# Patient Record
Sex: Female | Born: 1970 | Race: White | Hispanic: No | Marital: Married | Smoking: Never smoker
Health system: Southern US, Community
[De-identification: ages and names within clinical notes are randomized; demographics above are authoritative.]

## PROBLEM LIST (undated history)

## (undated) HISTORY — PX: BREAST SURGERY: SHX581

---

## 2010-12-14 ENCOUNTER — Ambulatory Visit: Payer: Self-pay | Admitting: General Practice

## 2011-10-19 ENCOUNTER — Ambulatory Visit: Payer: Self-pay | Admitting: General Practice

## 2012-02-12 IMAGING — US ABDOMEN ULTRASOUND
1 series · 17 of 25 positions shown · non-contrast
Comparison: none

REASON FOR EXAM: STAT CR  [id number] after 5pm Tantoun Billi at 214
5794 RUQ and epigastric...
COMMENTS:

[Series 1: abdomen ultrasound · 17 of 86 slices shown]
[im 1/86]
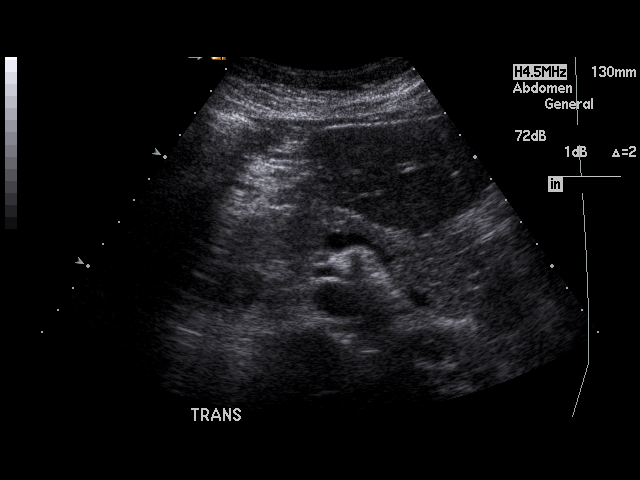
[im 8/86]
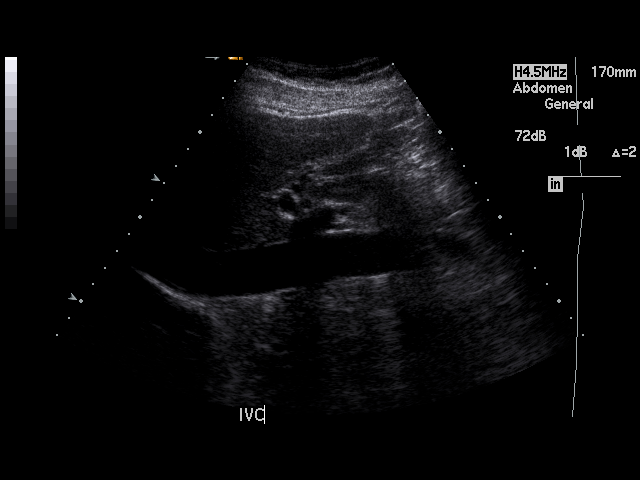
[im 11/86]
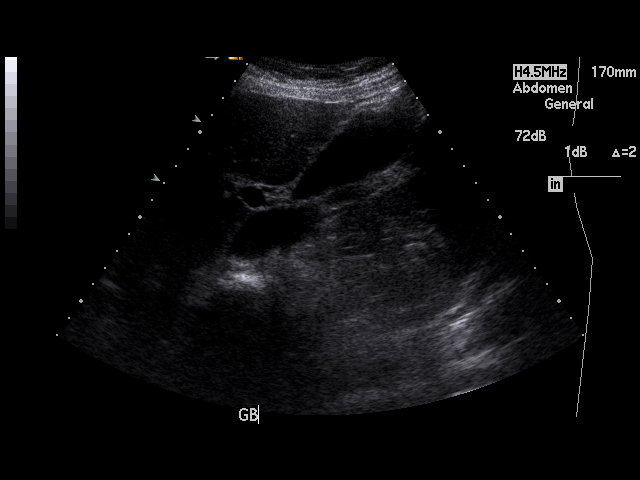
[im 18/86]
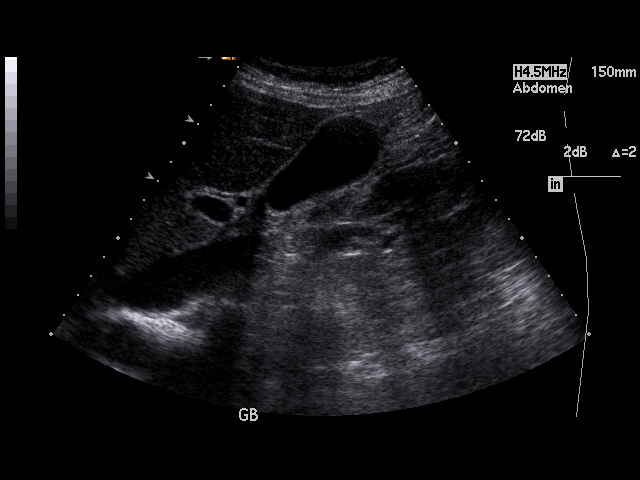
[im 22/86]
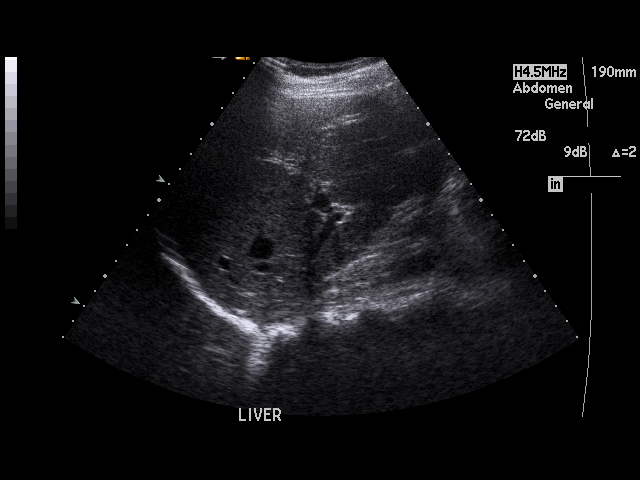
[im 29/86]
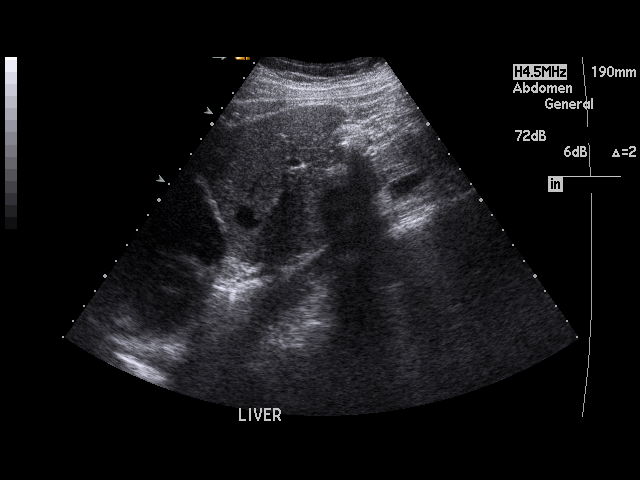
[im 32/86]
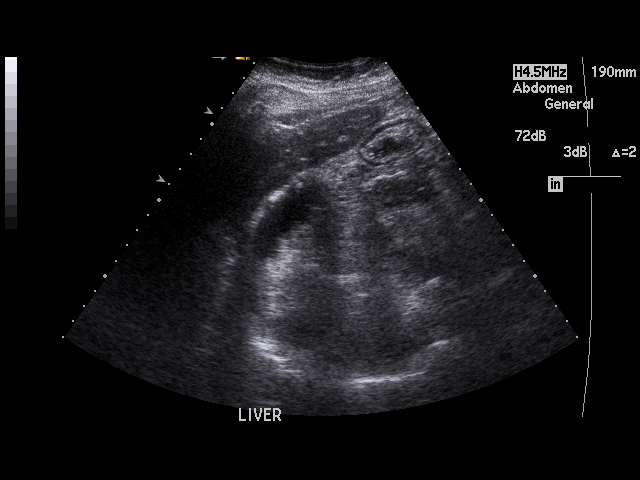
[im 39/86]
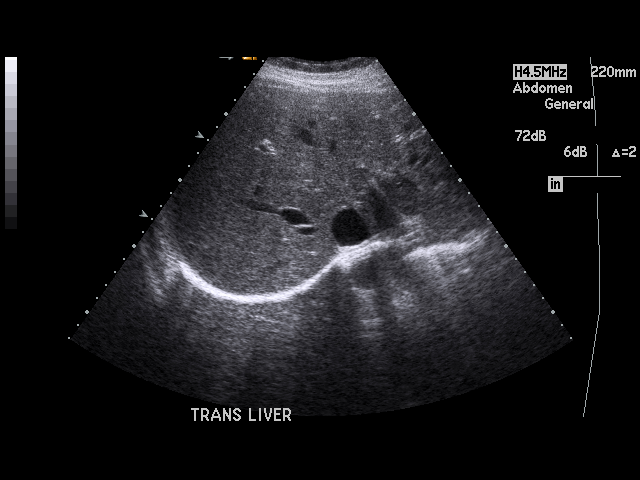
[im 43/86]
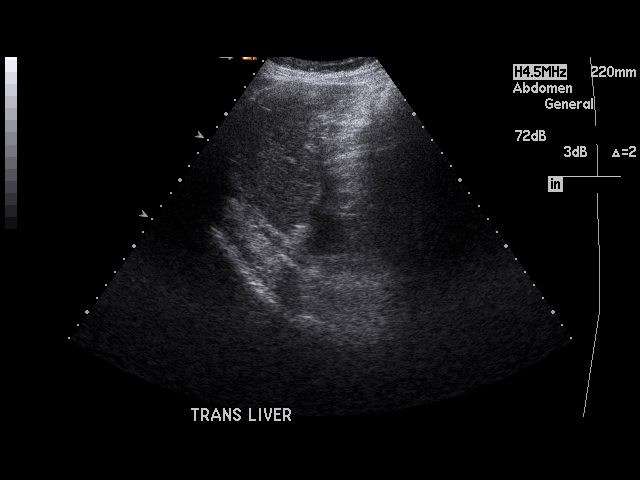
[im 47/86]
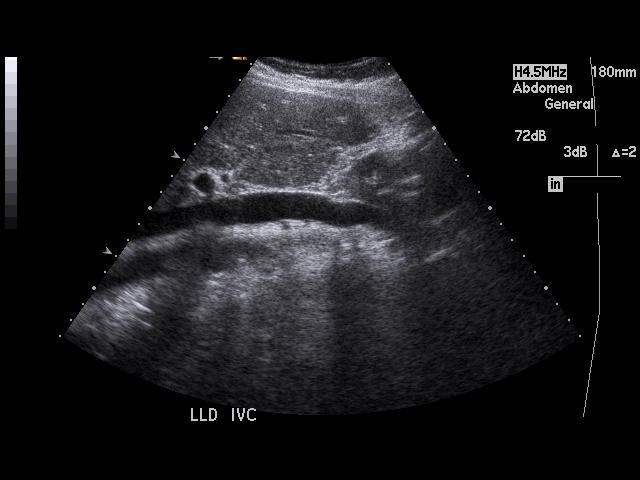
[im 54/86]
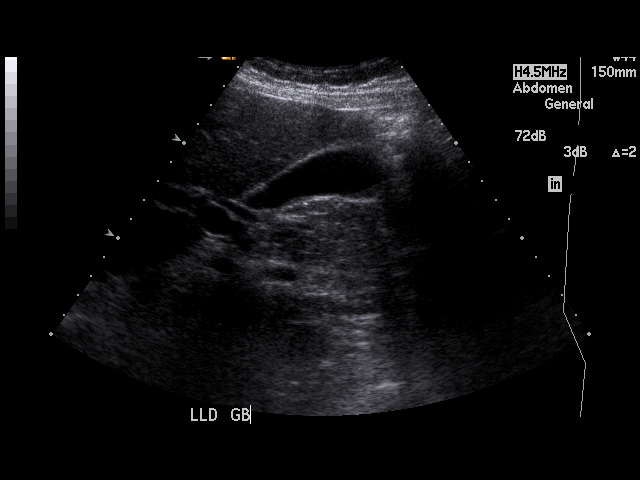
[im 57/86]
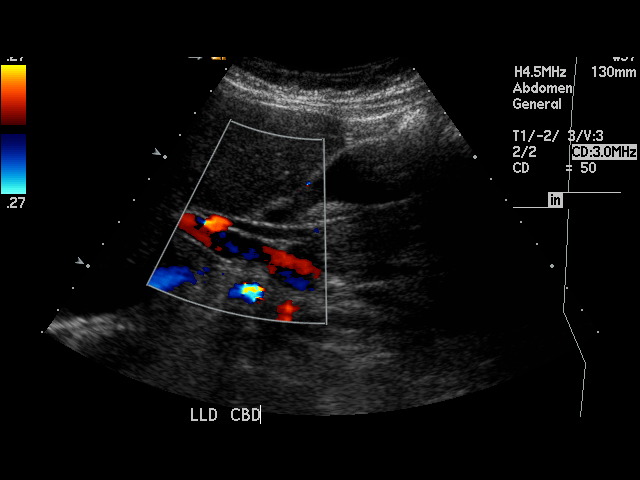
[im 64/86]
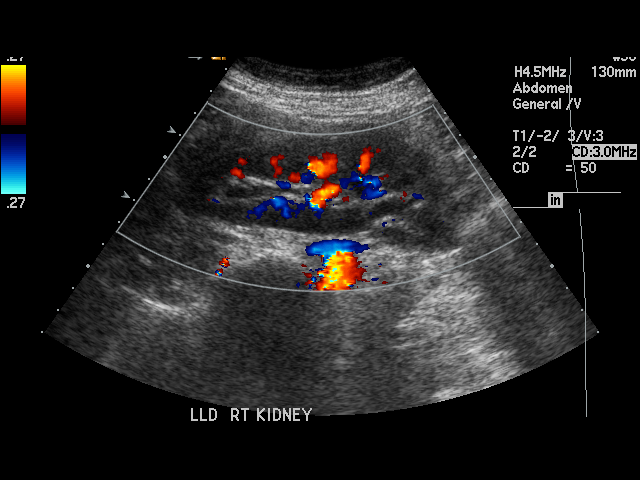
[im 68/86]
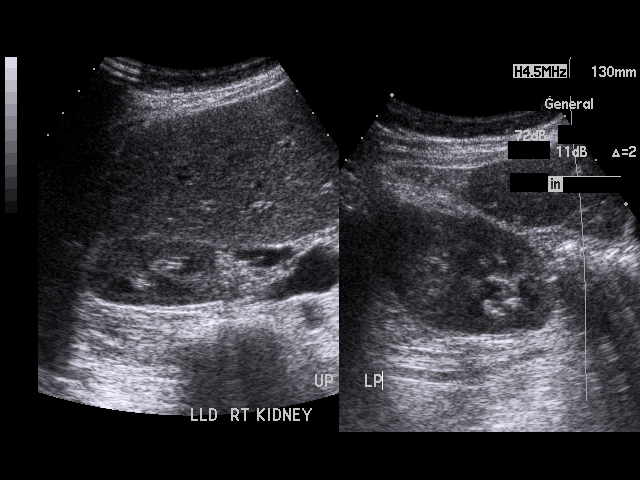
[im 75/86]
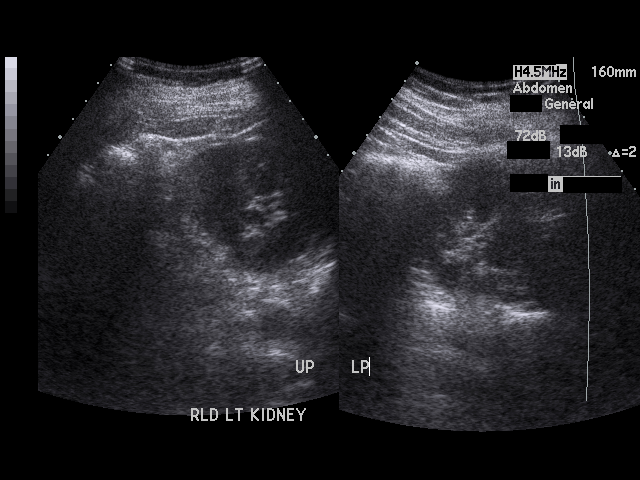
[im 78/86]
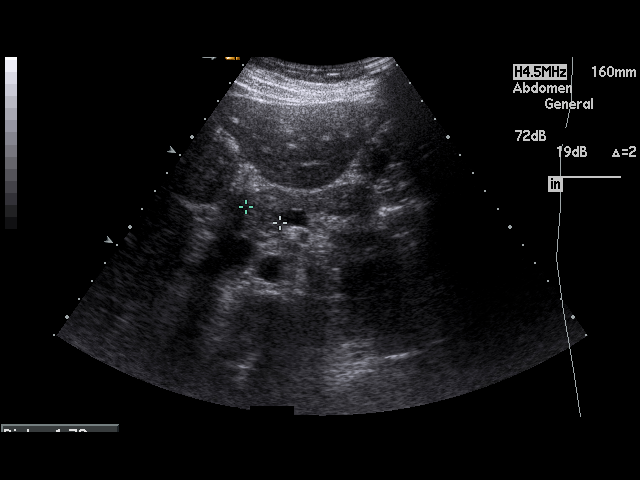
[im 86/86]
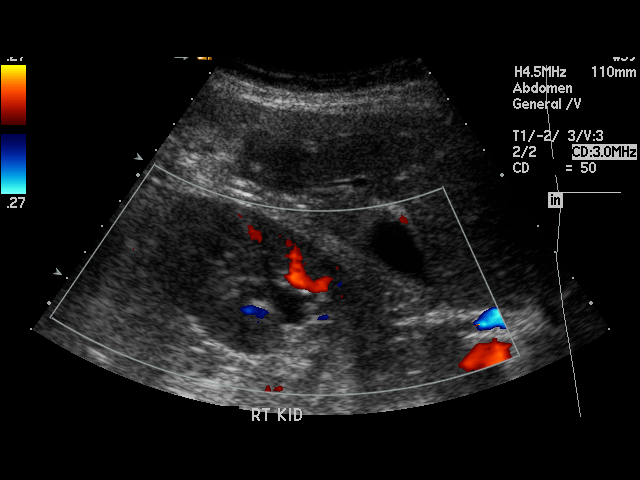

[17 of 25 positions shown; findings below may reference images not displayed]

PROCEDURE:     US  - US ABDOMEN GENERAL SURVEY  - December 14, 2010  [DATE]

RESULT:     The liver exhibits normal echotexture with no focal mass nor
ductal dilation. Portal venous flow is normal in direction toward the liver.
The gallbladder is adequately distended with no evidence of stones, wall
thickening, or pericholecystic fluid. There is no positive sonographic
Murphy's sign. The common bile duct is normal at 5.6 mm in diameter. The
pancreas, spleen, abdominal aorta, and left kidney are normal in appearance.
There is a small amount of splitting of the central echo complex on the
right which may reflect minimal hydronephrosis. There is no evidence of
perinephric fluid.
IMPRESSION: 1. I do not see evidence of acute hepatobiliary abnormality.
2. There is minimal hydronephrosis on the right.

## 2015-12-01 ENCOUNTER — Ambulatory Visit: Payer: Self-pay | Admitting: Physician Assistant

## 2015-12-01 ENCOUNTER — Encounter: Payer: Self-pay | Admitting: Physician Assistant

## 2015-12-01 VITALS — BP 100/60 | HR 84 | Temp 97.8°F

## 2015-12-01 DIAGNOSIS — R05 Cough: Secondary | ICD-10-CM

## 2015-12-01 DIAGNOSIS — R059 Cough, unspecified: Secondary | ICD-10-CM

## 2015-12-01 MED ORDER — METHYLPREDNISOLONE 4 MG PO TBPK: ORAL_TABLET | ORAL | Status: DC

## 2015-12-01 MED ORDER — BENZONATATE 200 MG PO CAPS: 200.0000 mg | ORAL_CAPSULE | Freq: Three times a day (TID) | ORAL | Status: DC | PRN

## 2015-12-01 NOTE — Progress Notes (Signed)
S: C/o runny nose and congestion for 3 days, no fever, chills, cp/sob, v/d; mucus  clear throughout the day, cough is sporadic,   O: PE: vitals wnl, nad, perrl eomi, normocephalic, tms dull, nasal mucosa red and swollen, throat injected, neck supple no lymph, lungs c t a, cv rrr, neuro intact  A:  Acute viral uri   P: medrol dose pack, tessalon perls; drink fluids, continue regular meds , use otc meds of choice, return if not improving in 5 days, return earlier if worsening

## 2015-12-02 ENCOUNTER — Ambulatory Visit: Payer: Self-pay | Admitting: Physician Assistant

## 2016-02-04 ENCOUNTER — Encounter: Payer: Self-pay | Admitting: Physician Assistant

## 2016-02-04 ENCOUNTER — Ambulatory Visit: Payer: Self-pay | Admitting: Physician Assistant

## 2016-02-04 VITALS — BP 112/64 | HR 76 | Temp 98.0°F

## 2016-02-04 DIAGNOSIS — H9202 Otalgia, left ear: Secondary | ICD-10-CM

## 2016-02-04 NOTE — Progress Notes (Signed)
S: c/o fb sensation in left ear, tickling sensation, no drainage, no dif hearing, no pain  O: vitals wnl, nad, tms dull b/l, no fb noted in canals b/l, neck supple no lymph, lungs c t a, cv rrr  A: ear discomfort  P: otc sudafed, flonase

## 2016-03-18 ENCOUNTER — Other Ambulatory Visit: Payer: Self-pay | Admitting: Physician Assistant

## 2016-06-17 ENCOUNTER — Ambulatory Visit: Payer: Self-pay | Admitting: Physician Assistant

## 2016-06-17 ENCOUNTER — Encounter: Payer: Self-pay | Admitting: Physician Assistant

## 2016-06-17 VITALS — BP 100/60 | HR 80 | Temp 98.7°F

## 2016-06-17 DIAGNOSIS — S46912A Strain of unspecified muscle, fascia and tendon at shoulder and upper arm level, left arm, initial encounter: Secondary | ICD-10-CM

## 2016-06-17 MED ORDER — METHOCARBAMOL 750 MG PO TABS: 750.0000 mg | ORAL_TABLET | Freq: Four times a day (QID) | ORAL | Status: DC

## 2016-06-17 MED ORDER — TRAMADOL HCL 50 MG PO TABS: 50.0000 mg | ORAL_TABLET | Freq: Three times a day (TID) | ORAL | Status: DC

## 2016-06-17 NOTE — Progress Notes (Signed)
   Subjective:Left supper shoulder pain    Patient ID: Lori Norton, female    DOB: 06/07/1971, 45 y.o.   MRN: [id number]  HPI Patient complain of left upper should pain that radiates to neck for 5 days. Status post overhead pulling incident. Patient states decrease range of motion with abduction and overhead reaching. No palliative measure for compliant. Patient is right hand dominate.   Review of Systems Negative except for compliant    Objective:   Physical Exam No obvious deformity to left upper extremity. No edema or erythema. N/V intact. Decrease Range of motion with overhead reaching and abduction. Guarding with palpation A/C joint.       Assessment & Plan:Left A/C strain  Sling for 2-3 days. Take Robaxin and Tramadol as directed. Continue Mobic.  Follow up 3 days if no improvement.

## 2016-07-13 ENCOUNTER — Ambulatory Visit: Payer: Self-pay | Admitting: Physician Assistant

## 2016-07-13 ENCOUNTER — Encounter: Payer: Self-pay | Admitting: Physician Assistant

## 2016-07-13 VITALS — BP 119/65 | HR 75 | Temp 98.3°F

## 2016-07-13 DIAGNOSIS — S93509A Unspecified sprain of unspecified toe(s), initial encounter: Secondary | ICD-10-CM

## 2016-07-13 MED ORDER — AZITHROMYCIN 250 MG PO TABS: ORAL_TABLET | ORAL | 0 refills | Status: DC

## 2016-07-13 MED ORDER — ONDANSETRON HCL 4 MG PO TABS: 4.0000 mg | ORAL_TABLET | Freq: Three times a day (TID) | ORAL | 0 refills | Status: DC | PRN

## 2016-07-13 NOTE — Progress Notes (Signed)
S: c/o toe pain, states she slipped when getting out of the shower and bent her toe back underneath her foot, some pain , hurts to walk on, no bruising, no other injury, also ? If needs travel meds, traveling to [place]  O: vitals wnl, nad, skin intact, no bruising noted, left great toe tender to palp at distal area, full rom, has good strenght in toe, n/v intact  A: sprained toe  P: wooden shoe, ice elevate, also gave rx for zpack and zofran [place]

## 2016-08-22 ENCOUNTER — Encounter: Payer: Self-pay | Admitting: Physician Assistant

## 2016-08-22 ENCOUNTER — Ambulatory Visit: Payer: Self-pay | Admitting: Physician Assistant

## 2016-08-22 VITALS — BP 124/61 | HR 77 | Temp 98.4°F

## 2016-08-22 DIAGNOSIS — N390 Urinary tract infection, site not specified: Secondary | ICD-10-CM

## 2016-08-22 DIAGNOSIS — N898 Other specified noninflammatory disorders of vagina: Secondary | ICD-10-CM

## 2016-08-22 LAB — POCT URINALYSIS DIPSTICK
BILIRUBIN UA: NEGATIVE
GLUCOSE UA: NEGATIVE
KETONES UA: NEGATIVE
LEUKOCYTES UA: NEGATIVE
NITRITE UA: NEGATIVE
Protein, UA: NEGATIVE
Spec Grav, UA: 1.01
Urobilinogen, UA: 0.2
pH, UA: 7

## 2016-08-22 NOTE — Progress Notes (Signed)
S: c/o fishy odor with vaginal discharge, no fever/chills, no itching, periods are irregular, mood is all over the place, ?if menopausal  O: vitals wnl, nad, ua wnl  A: perimenopausal  P: pt to see her gyn as we do not do pelvics in clinic

## 2016-10-19 ENCOUNTER — Other Ambulatory Visit: Payer: Self-pay | Admitting: *Deleted

## 2016-10-19 MED ORDER — IVERMECTIN 0.5 % EX LOTN: 1.0000 "application " | TOPICAL_LOTION | Freq: Once | CUTANEOUS | 2 refills | Status: AC

## 2017-05-23 ENCOUNTER — Other Ambulatory Visit: Payer: Self-pay | Admitting: *Deleted

## 2017-05-23 MED ORDER — FLUCONAZOLE 150 MG PO TABS: 150.0000 mg | ORAL_TABLET | Freq: Once | ORAL | 3 refills | Status: AC

## 2017-10-31 ENCOUNTER — Ambulatory Visit: Payer: Self-pay | Admitting: Physician Assistant

## 2017-10-31 ENCOUNTER — Encounter: Payer: Self-pay | Admitting: Physician Assistant

## 2017-10-31 VITALS — BP 145/65 | HR 84 | Temp 98.5°F | Resp 16

## 2017-10-31 DIAGNOSIS — S46912A Strain of unspecified muscle, fascia and tendon at shoulder and upper arm level, left arm, initial encounter: Secondary | ICD-10-CM

## 2017-10-31 MED ORDER — NAPROXEN 500 MG PO TABS: 500.0000 mg | ORAL_TABLET | Freq: Two times a day (BID) | ORAL | Status: DC

## 2017-10-31 MED ORDER — ORPHENADRINE CITRATE ER 100 MG PO TB12: 100.0000 mg | ORAL_TABLET | Freq: Two times a day (BID) | ORAL | 0 refills | Status: DC

## 2017-10-31 NOTE — Progress Notes (Signed)
   Subjective: Left shoulder pain     Patient ID: Lori Norton, female    DOB: 01/12/1971, 46 y.o.   MRN: [id number]  HPI Patient complaining of 3 days of left posterior shoulder pain. Patient stated pain radiates from her scapular and to superior aspect of her left shoulder. Onset of pain began after reaching and pulling out  Christmas decorations. Patient rates the pain as 8/10. Patient described a pain as "achy". Patient stated no relief with anti-inflammatory medications. Patient is right-hand dominant. Patient denies loss of sensation but decreased range of motion.   Review of Systems Negative except for complaint    Objective:   Physical Exam  No obvious deformities to the left upper extremity. Patient decreased range of motion with abduction overhead reaching. Patient straight against resistance 3/5 compared to the right. Patient is moderate guarding palpation inferior scapular muscle area.       Assessment & Plan: Left scapular muscle strain   Patient given discharge care instructions. Patient given a prescription for Norflex and naproxen. Advised to follow-up in one week.

## 2017-11-03 ENCOUNTER — Telehealth: Payer: Self-pay | Admitting: Emergency Medicine

## 2017-11-03 NOTE — Telephone Encounter (Signed)
Patient called and expressed that her pain level is getting worse and wanted to know if there was a different muscle relaxer medication we can call in.  I informed Durward Parcelon Smith, PA and he said that he has already given her the medication that should be working.  He advised that the patient needs to be referred to see an orthopedics since she continues to have pain.  I called the patient and left a message on her voicemail.

## 2017-12-29 ENCOUNTER — Ambulatory Visit: Payer: Self-pay | Admitting: Family Medicine

## 2017-12-29 VITALS — BP 128/76 | HR 73 | Temp 97.6°F

## 2017-12-29 DIAGNOSIS — J019 Acute sinusitis, unspecified: Secondary | ICD-10-CM

## 2017-12-29 DIAGNOSIS — J069 Acute upper respiratory infection, unspecified: Secondary | ICD-10-CM

## 2017-12-29 DIAGNOSIS — B9789 Other viral agents as the cause of diseases classified elsewhere: Secondary | ICD-10-CM

## 2017-12-29 MED ORDER — IPRATROPIUM BROMIDE 0.03 % NA SOLN
2.0000 | Freq: Two times a day (BID) | NASAL | 0 refills | Status: DC
Start: 2017-12-29 — End: 2018-03-01

## 2017-12-29 NOTE — Patient Instructions (Signed)
Drink plenty of fluids and get enough rest.  Try to take it easy this weekend.  Use the Atrovent nasal spray 1- 2 sprays each nostril 4 times a day if needed.  Use it as instructed.  Take over-the-counter Claritin-D or Allegra-D if needed for additional treatment of the congestion and drainage.    Take Tylenol or ibuprofen as needed for fever, aches, or generalized malaise.  Wash hands well and try to avoid exposing her family.  Return as needed

## 2017-12-29 NOTE — Progress Notes (Signed)
Patient ID: Lori Norton, female    DOB: 07/20/1971  Age: 47 y.o. MRN: [id number]  Chief Complaint  Patient presents with  . Sinus Problem    x3 days  . Nasal Congestion    x3 days    Subjective:   The patient presents with a 3-day history of nasal congestion.  She has blown some purulent looking mucus out some clear.  She does not smoke.  She does not have excessive numbers of URIs.  She has not been febrile.  Does not describe a lot of body aching.  Is not coughing a lot.  She is fatigued.  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  She has 3 adolescent and the preadolescent children at home, has a hard time getting extra rest.  She is on lansoprazole and sertraline.  Current allergies, medications, problem list, past/family and social histories reviewed.  Objective:  BP 128/76 (BP Location: Left Arm, Patient Position: Sitting, Cuff Size: Normal)   Pulse 73   Temp 97.6 F (36.4 C) (Oral)   SpO2 97%   No major acute distress except for has an obvious cold and stuffy head.  TMs are normal.  Nose has clear mucus in it and swelling of the turbinates.  Her throat looks clear though she has had some sore throat from the drainage.  Her mouth feels dry at night. Without significant nodes.  Chest is clear to auscultation.  Heart regular without murmurs.  Assessment & Plan:   Assessment: 1. Viral URI   2. Acute viral sinusitis       Plan: This is most consistent with a viral URI.  Treat symptomatically.  No orders of the defined types were placed in this encounter.   No orders of the defined types were placed in this encounter.        Patient Instructions  Drink plenty of fluids and get enough rest.  Try to take it easy this weekend.  Use the Atrovent nasal spray 1- 2 sprays each nostril 4 times a day if needed.  Use it as instructed.  Take over-the-counter Claritin-D or Allegra-D if needed for additional treatment of the congestion and drainage.    Take Tylenol or  ibuprofen as needed for fever, aches, or generalized malaise.  Wash hands well and try to avoid exposing her family.  Return as needed    No Follow-up on file.   HOPPER,DAVID, MD 12/29/2017

## 2018-01-01 ENCOUNTER — Encounter: Payer: Self-pay | Admitting: Emergency Medicine

## 2018-01-01 ENCOUNTER — Other Ambulatory Visit: Payer: Self-pay

## 2018-01-01 ENCOUNTER — Emergency Department
Admission: EM | Admit: 2018-01-01 | Discharge: 2018-01-02 | Disposition: A | Discharge status: Home / Self Care | Payer: Managed Care, Other (non HMO) | Attending: Emergency Medicine | Admitting: Emergency Medicine

## 2018-01-01 DIAGNOSIS — Z79899 Other long term (current) drug therapy: Secondary | ICD-10-CM | POA: Diagnosis not present

## 2018-01-01 DIAGNOSIS — I951 Orthostatic hypotension: Secondary | ICD-10-CM | POA: Insufficient documentation

## 2018-01-01 DIAGNOSIS — R42 Dizziness and giddiness: Secondary | ICD-10-CM

## 2018-01-01 DIAGNOSIS — R531 Weakness: Secondary | ICD-10-CM | POA: Diagnosis not present

## 2018-01-01 LAB — BASIC METABOLIC PANEL
Anion gap: 10 (ref 5–15)
BUN: 13 mg/dL (ref 6–20)
CHLORIDE: 101 mmol/L (ref 101–111)
CO2: 24 mmol/L (ref 22–32)
CREATININE: 0.75 mg/dL (ref 0.44–1.00)
Calcium: 9.2 mg/dL (ref 8.9–10.3)
GFR calc non Af Amer: >60 mL/min (ref >60)
Glucose, Bld: 111 mg/dL — ABNORMAL HIGH (ref 65–99)
Potassium: 3.1 mmol/L — ABNORMAL LOW (ref 3.5–5.1)
SODIUM: 135 mmol/L (ref 135–145)

## 2018-01-01 LAB — CBC
HEMATOCRIT: 39.5 % (ref 35.0–47.0)
Hemoglobin: 12.9 g/dL (ref 12.0–16.0)
MCH: 26.5 pg (ref 26.0–34.0)
MCHC: 32.6 g/dL (ref 32.0–36.0)
MCV: 81.4 fL (ref 80.0–100.0)
Platelets: 251 10*3/uL (ref 150–440)
RBC: 4.85 MIL/uL (ref 3.80–5.20)
RDW: 15.3 % — ABNORMAL HIGH (ref 11.5–14.5)
WBC: 10.4 10*3/uL (ref 3.6–11.0)

## 2018-01-01 NOTE — Discharge Instructions (Signed)
Please seek medical attention for any high fevers, chest pain, shortness of breath, change in behavior, persistent vomiting, bloody stool or any other new or concerning symptoms.  

## 2018-01-01 NOTE — ED Notes (Signed)
Pt refuses IV at this time. Derrill KayGoodman MD aware.

## 2018-01-01 NOTE — ED Triage Notes (Signed)
Pt presents to ED with c/o dizziness and hypotension today. Pt states she had a sudden onset of dizziness around 1300 while at work and was encouraged by her employer to be seen at urgent care. Pt became more hypotensive while [facility] and they sent her to ED for further evaluation. C/o worsening dizziness upon standing. Denies n/v/d. Denies chest pain.

## 2018-01-01 NOTE — ED Notes (Signed)
Pt c/o worsening dizziness after blood was drawn. Provided for pt safety. Blood pressure 75/36.

## 2018-01-01 NOTE — ED Notes (Signed)
Pt presents today for hypotension. Pt states after lunch Pt felt lightheaded and weak. 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 Pt was dizzy and couldn't hear like she was going pass out.

## 2018-01-02 LAB — TROPONIN I: Troponin I: <0.03 ng/mL (ref <0.03)

## 2018-01-02 NOTE — ED Provider Notes (Signed)
[facility] Emergency Department Provider Note   ____________________________________________   I have reviewed the triage vital signs and the nursing notes.   HISTORY  Chief Complaint Hypotension and Dizziness   History limited by: Not Limited   HPI Lori Norton is a 47 y.o. female who presents to the emergency department today because of concern for lightheadedness, weakness and hypotension.  Patient states she was at work when this happened.  She noticed that when she was switching positions from sitting to standing.  She tried to lie down in the past however did not.  At work she checked her blood pressure was noted to be low.  She denies concurrent chest pain or palpitations.   Per medical record review patient has a history of breast surgery.  History reviewed. No pertinent past medical history.  There are no active problems to display for this patient.   Past Surgical History:  Procedure Laterality Date  . BREAST SURGERY      Prior to Admission medications   Medication Sig Start Date End Date Taking? Authorizing Provider  ipratropium (ATROVENT) 0.03 % nasal spray Place 2 sprays into both nostrils 2 (two) times daily. 12/29/17   Peyton Najjar, MD  pantoprazole (PROTONIX) 40 MG tablet  12/01/15   [provider]  sertraline (ZOLOFT) 100 MG tablet Take 100 mg by mouth daily.    [provider]    Allergies Patient has no known allergies.  No family history on file.  Social History Social History   Tobacco Use  . Smoking status: Never Smoker  . Smokeless tobacco: Never Used  Substance Use Topics  . Alcohol use: Yes    Alcohol/week: 0.0 oz    Frequency: Never  . Drug use: No    Review of Systems Constitutional: No fever/chills. Positive for lightheadedness. Eyes: No visual changes. ENT: No sore throat. Cardiovascular: Denies chest pain. Respiratory: Denies shortness of breath. Gastrointestinal: No abdominal  pain.  No nausea, no vomiting.  No diarrhea.   Genitourinary: Negative for dysuria. Musculoskeletal: Negative for back pain. Skin: Negative for rash. Neurological: Negative for headaches, focal weakness or numbness.  ____________________________________________   PHYSICAL EXAM:  VITAL SIGNS: ED Triage Vitals  Enc Vitals Group     BP 01/01/18 1933 (!) 118/57     Pulse Rate 01/01/18 1943 73     Resp 01/01/18 1933 18     Temp 01/01/18 1933 97.8 F (36.6 C)     Temp Source 01/01/18 1933 Oral     SpO2 01/01/18 1933 98 %     Weight 01/01/18 1934 170 lb (77.1 kg)     Height 01/01/18 1934 5\' 2"  (1.575 m)    Constitutional: Alert and oriented. Well appearing and in no distress. Eyes: Conjunctivae are normal.  ENT   Head: Normocephalic and atraumatic.   Nose: No congestion/rhinnorhea.   Mouth/Throat: Mucous membranes are moist.   Neck: No stridor. Hematological/Lymphatic/Immunilogical: No cervical lymphadenopathy. Cardiovascular: Normal rate, regular rhythm.  No murmurs, rubs, or gallops.  Respiratory: Normal respiratory effort without tachypnea nor retractions. Breath sounds are clear and equal bilaterally. No wheezes/rales/rhonchi. Gastrointestinal: Soft and non tender. No rebound. No guarding.  Genitourinary: Deferred Musculoskeletal: Normal range of motion in all extremities. No lower extremity edema. Neurologic:  Normal speech and language. No gross focal neurologic deficits are appreciated.  Skin:  Skin is warm, dry and intact. No rash noted. Psychiatric: Mood and affect are normal. Speech and behavior are normal. Patient exhibits appropriate insight  and judgment.  ____________________________________________    LABS (pertinent positives/negatives)  Trop <0.03 CBC wnl except rdw 15.3 BMP k 3.1, glu 111 ____________________________________________   EKG  I, Phineas SemenGraydon Darrall Strey, attending physician, personally viewed and interpreted this EKG  EKG Time:  1933 Rate: 75 Rhythm: normal sinus rhythm Axis: normal Intervals: qtc 433 QRS: narrow ST changes: no st elevation Impression: normal ekg   ____________________________________________    RADIOLOGY  None  ____________________________________________   PROCEDURES  Procedures  ____________________________________________   INITIAL IMPRESSION / ASSESSMENT AND PLAN / ED COURSE  Pertinent labs & imaging results that were available during my care of the patient were reviewed by me and considered in my medical decision making (see chart for details).  Patient presented to the emergency department today because of concerns for lightheadedness and low blood pressure.  Patient was found to be orthostatic here.  The differential would include low sodium, anemia, cardiac abnormality.  Workup here without concerning findings except for the static hypotension.  Patient however did not want IV fluids.  Discussed importance of hydration.  Discussed return precautions.   ____________________________________________   FINAL CLINICAL IMPRESSION(S) / ED DIAGNOSES  Final diagnoses:  Lightheadedness  Orthostatic hypotension     Note: This dictation was prepared with Dragon dictation. Any transcriptional errors that result from this process are unintentional     Phineas SemenGoodman, Denny Lave, MD 01/02/18 [id number]

## 2018-03-01 ENCOUNTER — Ambulatory Visit: Payer: Self-pay | Admitting: Family Medicine

## 2018-03-01 ENCOUNTER — Encounter: Payer: Self-pay | Admitting: Family Medicine

## 2018-03-01 ENCOUNTER — Ambulatory Visit: Payer: Self-pay

## 2018-03-01 VITALS — BP 119/58 | HR 89 | Temp 98.7°F | Resp 18

## 2018-03-01 DIAGNOSIS — J0100 Acute maxillary sinusitis, unspecified: Secondary | ICD-10-CM

## 2018-03-01 DIAGNOSIS — J302 Other seasonal allergic rhinitis: Secondary | ICD-10-CM

## 2018-03-01 MED ORDER — FLUTICASONE PROPIONATE 50 MCG/ACT NA SUSP: NASAL | 0 refills | Status: AC

## 2018-03-01 MED ORDER — AMOXICILLIN-POT CLAVULANATE 875-125 MG PO TABS: 1.0000 | ORAL_TABLET | Freq: Two times a day (BID) | ORAL | 0 refills | Status: AC

## 2018-03-01 NOTE — Progress Notes (Signed)
Subjective: Congestion     Lori Norton is a 47 y.o. female who presents for evaluation of nasal congestion with purulent nasal drainage and headache/facial pressure for 3 weeks.  Patient feels like symptoms were stable but that they worsened with the addition of chills, fatigue, worsening nasal congestion, and nonproductive cough starting 3 days ago.  Patient has a history of allergic rhinitis which she takes Zyrtec for.  Reports this does not feel like her allergies, as they are typically extremely mild and helped with Zyrtec.  Denies fever. Treatment to date: Zyrtec.  Denies rash, nausea, vomiting, diarrhea, shortness of breath, wheezing, chest or back pain, ear pain, sore throat, difficulty swallowing, confusion, body aches, or severe symptoms. History of smoking, asthma, COPD: Negative History of recurrent sinus and/or lung infections: Negative. Medical history: Depression and allergic rhinitis. Antibiotic use in the last 3 months: Negative.   Review of Systems Pertinent items noted in HPI and remainder of comprehensive ROS otherwise negative.     Objective:   Physical Exam General: Awake, alert, and oriented. No acute distress. Well developed, hydrated and nourished. Appears stated age. Nontoxic appearance.  HEENT:  PND noted.  No erythema to posterior oropharynx.  No edema or exudates of pharynx or tonsils. No erythema or bulging of TM.  Mild erythema/edema to nasal mucosa.  Bilateral maxillary sinus tenderness.  Remainder of sinuses nontender. Supple neck without adenopathy. Cardiac: Heart rate and rhythm are normal. No murmurs, gallops, or rubs are auscultated. S1 and S2 are heard and are of normal intensity.  Respiratory: No signs of respiratory distress. Lungs clear. No tachypnea. Able to speak in full sentences without dyspnea. Nonlabored respirations.  Skin: Skin is warm, dry and intact. Appropriate color for ethnicity. No cyanosis noted.   Diagnostic Results:  None.  Assessment:    sinusitis   Plan:    Discussed the diagnosis and treatment of sinusitis. Suggested symptomatic OTC remedies. Nasal saline spray for congestion. Nasal steroids per orders.   Augmentin prescribed. Discussed how to take these medications and side/adverse effects of these. Follow-up with primary care provider. Red flag symptoms and indications to seek medical care discussed.  New Prescriptions   AMOXICILLIN-CLAVULANATE (AUGMENTIN) 875-125 MG TABLET    Take 1 tablet by mouth 2 (two) times daily for 10 days.   FLUTICASONE (FLONASE) 50 MCG/ACT NASAL SPRAY    1-2 sprays into each nostril daily

## 2018-05-23 ENCOUNTER — Ambulatory Visit: Payer: Self-pay | Admitting: Family Medicine

## 2018-05-23 VITALS — BP 107/57 | HR 69 | Resp 15 | Ht 63.0 in | Wt 174.0 lb

## 2018-05-23 DIAGNOSIS — Z0189 Encounter for other specified special examinations: Principal | ICD-10-CM

## 2018-05-23 DIAGNOSIS — Z008 Encounter for other general examination: Secondary | ICD-10-CM

## 2018-05-23 NOTE — Progress Notes (Signed)
Subjective: Annual biometrics screening labs Patient presents for her annual biometric screening labs only.  Patient had her annual physical exam with her primary care provider on 10/13/2017.  Patient denies any other issues or concerns.   Assessment Annual biometrics screening labs  Plan  Lipid panel and fasting blood sugar pending. Encouraged routine visits with primary care provider.

## 2018-05-24 LAB — LIPID PANEL
CHOL/HDL RATIO: 3.6 ratio (ref 0.0–4.4)
CHOLESTEROL TOTAL: 180 mg/dL (ref 100–199)
HDL: 50 mg/dL (ref >39)
LDL CALC: 103 mg/dL — AB (ref 0–99)
Triglycerides: 134 mg/dL (ref 0–149)
VLDL Cholesterol Cal: 27 mg/dL (ref 5–40)

## 2018-05-24 LAB — GLUCOSE, RANDOM: GLUCOSE: 95 mg/dL (ref 65–99)

## 2018-05-24 NOTE — Progress Notes (Signed)
Lori Norton, Will you call the patient and inform them that their lipid panel and fasting blood sugar came back?  Everything is normal, with the exception of her LDL cholesterol.  The LDL cholesterol ("bad cholesterol") is elevated at 103, normal values are below 99. Please advise the patient to follow-up with their primary care provider regarding these results.

## 2020-09-07 ENCOUNTER — Ambulatory Visit: Payer: Managed Care, Other (non HMO)

## 2020-09-07 ENCOUNTER — Other Ambulatory Visit: Payer: Self-pay
